# Patient Record
Sex: Female | Born: 2005 | Race: White | Hispanic: No | Marital: Single | Smoking: Never smoker
Health system: Southern US, Community
[De-identification: ages and names within clinical notes are randomized; demographics above are authoritative.]

---
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
## 2024-08-07 DIAGNOSIS — R051 Acute cough: Secondary | ICD-10-CM | POA: Diagnosis not present

## 2024-08-07 DIAGNOSIS — J029 Acute pharyngitis, unspecified: Secondary | ICD-10-CM | POA: Diagnosis not present

## 2024-08-07 DIAGNOSIS — R509 Fever, unspecified: Secondary | ICD-10-CM | POA: Diagnosis not present
